# Patient Record
Sex: Male | Born: 1985 | Hispanic: No | Marital: Single | Smoking: Current some day smoker
Health system: Southern US, Community
[De-identification: ages and names within clinical notes are randomized; demographics above are authoritative.]

---
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
## 2013-05-19 ENCOUNTER — Emergency Department (HOSPITAL_COMMUNITY)
Admission: EM | Admit: 2013-05-19 | Discharge: 2013-05-20 | Disposition: A | Discharge status: Home / Self Care | Payer: 59 | Attending: Emergency Medicine | Admitting: Emergency Medicine

## 2013-05-19 ENCOUNTER — Emergency Department (HOSPITAL_COMMUNITY): Payer: 59

## 2013-05-19 DIAGNOSIS — F29 Unspecified psychosis not due to a substance or known physiological condition: Secondary | ICD-10-CM | POA: Insufficient documentation

## 2013-05-19 DIAGNOSIS — Y9389 Activity, other specified: Secondary | ICD-10-CM | POA: Insufficient documentation

## 2013-05-19 DIAGNOSIS — T401X4A Poisoning by heroin, undetermined, initial encounter: Secondary | ICD-10-CM | POA: Insufficient documentation

## 2013-05-19 DIAGNOSIS — IMO0002 Reserved for concepts with insufficient information to code with codable children: Secondary | ICD-10-CM | POA: Insufficient documentation

## 2013-05-19 DIAGNOSIS — Y929 Unspecified place or not applicable: Secondary | ICD-10-CM | POA: Insufficient documentation

## 2013-05-19 DIAGNOSIS — T401X1A Poisoning by heroin, accidental (unintentional), initial encounter: Secondary | ICD-10-CM | POA: Insufficient documentation

## 2013-05-19 LAB — COMPREHENSIVE METABOLIC PANEL
AST: 55 U/L — ABNORMAL HIGH (ref 0–37)
Albumin: 3.6 g/dL (ref 3.5–5.2)
Chloride: 101 mEq/L (ref 96–112)
Creatinine, Ser: 1.02 mg/dL (ref 0.50–1.35)
Total Bilirubin: 0.5 mg/dL (ref 0.3–1.2)
Total Protein: 6.9 g/dL (ref 6.0–8.3)

## 2013-05-19 LAB — CBC
MCHC: 33.7 g/dL (ref 30.0–36.0)
MCV: 81.9 fL (ref 78.0–100.0)
Platelets: 284 10*3/uL (ref 150–400)
RDW: 13.2 % (ref 11.5–15.5)
WBC: 26.4 10*3/uL — ABNORMAL HIGH (ref 4.0–10.5)

## 2013-05-19 LAB — SALICYLATE LEVEL: Salicylate Lvl: <2 mg/dL — ABNORMAL LOW (ref 2.8–20.0)

## 2013-05-19 LAB — ACETAMINOPHEN LEVEL: Acetaminophen (Tylenol), Serum: <15 ug/mL (ref 10–30)

## 2013-05-19 MED ORDER — ONDANSETRON HCL 4 MG/2ML IJ SOLN
4.0000 mg | Freq: Once | INTRAMUSCULAR | Status: AC
  Administered 2013-05-20: 4 mg via INTRAVENOUS
  Filled 2013-05-19: qty 2

## 2013-05-19 MED ORDER — SODIUM CHLORIDE 0.9 % IV BOLUS (SEPSIS)
1000.0000 mL | Freq: Once | INTRAVENOUS | Status: AC
  Administered 2013-05-20: 1000 mL via INTRAVENOUS
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
## 2013-05-19 NOTE — ED Notes (Signed)
Bed: RESA Expected date:  Expected time:  Means of arrival:  Comments: EMS Overdose 

## 2013-05-19 NOTE — ED Provider Notes (Signed)
CSN: [id number]     Arrival date & time 05/19/13  1955 History   First MD Initiated Contact with Patient 05/19/13 2041     Chief Complaint  Patient presents with  . Drug Overdose   (Consider location/radiation/quality/duration/timing/severity/associated sxs/prior Treatment) HPI Comments: Patient states he injected Heroin today remembers buying &20.00 worth. Last time remembered about 5PM./ Was found in parking lot unresponsive, EMS was called and patient was apneic- given Narcan IV and assisted respiration by bag.  Now awake-- states can not remember the incident   Patient is a 27 y.o. male presenting with Overdose. The history is provided by the patient and the EMS personnel.  Drug Overdose This is a new problem. The current episode started today. Pertinent negatives include no chest pain, fever or neck pain.    No past medical history on file. No past surgical history on file. No family history on file. History  Substance Use Topics  . Smoking status: Not on file  . Smokeless tobacco: Not on file  . Alcohol Use: Not on file    Review of Systems  Constitutional: Positive for activity change. Negative for fever.  HENT: Negative for neck pain and neck stiffness.   Eyes: Negative for photophobia and redness.  Respiratory: Negative for shortness of breath.   Cardiovascular: Negative for chest pain.  Skin: Positive for wound.  Psychiatric/Behavioral: Positive for confusion.    Allergies  Review of patient's allergies indicates no known allergies.  Home Medications  No current outpatient prescriptions on file. BP 129/79  Pulse 104  Temp(Src) 98.8 F (37.1 C) (Oral)  Resp 15  Ht 6\' 4"  (1.93 m)  Wt 180 lb (81.647 kg)  BMI 21.92 kg/m2  SpO2 92% Physical Exam  Nursing note and vitals reviewed. Constitutional: He appears well-developed and well-nourished.  HENT:  Head: Normocephalic. Head is with abrasion and with left periorbital erythema.    Right Ear: External ear  normal.  Left Ear: External ear normal.  Mouth/Throat: Oropharynx is clear and moist.  Eyes: Pupils are equal, round, and reactive to light.  Neck: Normal range of motion.  Pulmonary/Chest: Effort normal.  Abdominal: Soft. Bowel sounds are normal.  Musculoskeletal: Normal range of motion.       Right shoulder: Normal. He exhibits no bony tenderness.       Legs: 20 X30 oval red area inner L thigh Abrasion anterior R shoulder  Neurological: He is alert.  Skin: Skin is warm.    ED Course  Procedures (including critical care time) Labs Review Labs Reviewed  CBC - Abnormal; Notable for the following:    WBC 26.4 (*)    All other components within normal limits  COMPREHENSIVE METABOLIC PANEL - Abnormal; Notable for the following:    Glucose, Bld 202 (*)    AST 55 (*)    All other components within normal limits  SALICYLATE LEVEL - Abnormal; Notable for the following:    Salicylate Lvl <2.0 (*)    All other components within normal limits  URINE RAPID DRUG SCREEN (HOSP PERFORMED) - Abnormal; Notable for the following:    Opiates POSITIVE (*)    Tetrahydrocannabinol POSITIVE (*)    All other components within normal limits  ETHANOL  ACETAMINOPHEN LEVEL   Imaging Review Ct Head Wo Contrast  05/19/2013   *RADIOLOGY REPORT*  Clinical Data: Trauma.  CT HEAD WITHOUT CONTRAST  Technique:  Contiguous axial images were obtained from the base of the skull through the vertex without contrast.  Comparison: 02/06/2005  Findings:  Skull:No acute osseous abnormality. No lytic or blastic lesion.  Orbits: No acute abnormality.  Brain: No evidence of acute abnormality, such as acute infarction, hemorrhage, hydrocephalus, or mass lesion/mass effect.Incidental dilated perivascular space in the inferior left cerebrum.  IMPRESSION:  No evidence of acute intracranial injury.   Original Report Authenticated By: Tiburcio Pea    MDM   1. Heroin overdose, initial encounter     Heroin overdose will  continue monitoring   Paitent feeling better Offered detox counseling-- not interested  PAtient has maintianed his respiratory status safe to DC home   Arman Filter, NP 05/20/13 [phone]

## 2013-05-20 LAB — RAPID URINE DRUG SCREEN, HOSP PERFORMED
Benzodiazepines: NOT DETECTED
Cocaine: NOT DETECTED
Opiates: POSITIVE — AB

## 2013-05-20 NOTE — Discharge Instructions (Signed)
RESOURCE GUIDE ° °Chronic Pain Problems: °Contact [facility]  [phone] °Patients need to be referred by their primary care doctor. ° °Insufficient Money for Medicine: °Contact United Way:  call [phone]) 892-1162 ° °No Primary Care Doctor: °- Call [facility]  [phone] - can help you locate a primary care doctor that  accepts your insurance, provides certain services, etc. °- Physician Referral Service- 1-800-533-3463 ° °Agencies that provide inexpensive medical care: °- [facility]  [phone] °- [facility]  [phone] °- [facility]  [phone] °- [facility]  832-4777 °- [facility]  [phone] °- [facility]  [phone] ° °Medicaid-accepting [place] Providers: °- [facility]- [address] ° [phone], Mon-Fri 9am-7pm, Sat 9am-1pm °- [facility]- [address], [address] ° [phone] °- [facility]- [address] ° [phone] °- [facility]- [address] [place] [address] ° [phone] °- [facility]- [address], [phone] ° Only accepts [place] Access Medicaid patients after they have their name  applied to their card ° °Self Pay (no insurance) in [place]: °- Sickle Cell Patients - [facility] ° [address], [phone] °- [facility]- [address] ° [phone] °      -     [facility]- [address] °      -     [facility]- see information above (Speak to Pam H if you do not have insurance) °      -  [facility] [place]- [address],  [phone] °      -  [facility]- [address] [place] [address], [phone] °      -  Dr Osei-Bonsu-  [address], [place], [phone] °      -  [facility] - [address], [phone] °      -  [facility]- [address] [place] [address], [phone], also  [address], [phone] °      -     [facility]- [address], [phone], 1st & 3rd Saturday °        every month, 10am-1pm ° -     [facility] °  [address], [place]. °  Phone:  [phone], Fax:  [phone]. Hours of Operation:  9 am - 6 pm, M-F. ° -     [facility] °  [address], [place] °  Phone: [phone], Fax: [phone]. Hours of Operation:  8:30 am - 5:30 pm, M-F. ° °[facility] °[address] °, [place] [address] °(336) [phone] ° °[facility] °[address] °[place], [place] [address] °(336) [phone] ° °1) Find a Doctor and Pay Out of Pocket °Although you won't have to find out who is covered by your insurance plan, it is a good idea to ask around and get recommendations. You will then need to call the office and see if the doctor you have chosen will accept you as a new patient and what types of options they offer for patients who are self-pay. Some doctors offer discounts or will set up payment plans for their patients who do not have insurance, but   you will need to ask so you aren't surprised when you get to your appointment. ° °2) Contact Your Local Health Department °Not all health departments have doctors that can see patients for sick visits, but many do, so it is worth a call to see if yours does. If you don't know where your local health department is, you can check in your phone book. The CDC also has a tool to help you locate your state's health department, and many state websites also have listings of all of their local health departments. ° °3) Find a Walk-in Clinic °If your illness is not likely to be very severe or complicated, you may want to try a walk in clinic. These are popping up all over the country in pharmacies, drugstores, and shopping centers. They're usually staffed by nurse practitioners or physician assistants that have been trained  to treat common illnesses and complaints. They're usually fairly quick and inexpensive. However, if you have serious medical issues or chronic medical problems, these are probably not your best option ° °STD Testing °- [place] [facility] [place], [facility], [address], [place], phone [phone] or 1-877-539-9860.  Monday - Friday, call for an appointment. °- [place] [facility] [place], [facility], [address], [place], phone [phone] or 1-877-539-9860.  Monday - Friday, call for an appointment. ° °Abuse/Neglect: °- [place] Child Abuse Hotline (336) 641-3795 °- [place] Child Abuse Hotline 800-378-5315 (After Hours) ° °Emergency Shelter:  [facility] (336) 271-5985 ° °Maternity Homes: °- [facility] (336) 275-9566 °- [facility] (704) 372-4663 ° °MRSA Hotline #:   832-7006 ° °Dental Assistance °If unable to pay or uninsured, contact:  [place] [facility] to become qualified for the adult dental clinic. ° °Patients with Medicaid: [facility] °[address], [phone] °[address], [phone] ° °If unable to pay, or uninsured, contact [place] [facility] ([phone] in [place], [phone] in [place]) to become qualified for the adult dental clinic ° °[facility] °[address] °[place], [place] [address] °(336) 272-4177 °www.[facility].com ° °Other Low-Cost Community Dental Services: °- [facility]- [address], [place], [place], [address], [phone], Ext. 123, 2nd and 4th Thursday of the month at 6:30am.  10 clients each day by appointment, can sometimes see walk-in patients if someone does not show for an appointment. °- [facility]- [address], [place], [place], [address], [phone] °- [facility]- [address], [place], [place], [address], [phone] °- [place]  [facility]- [phone] °- [facility]- 703-3100 °-  [facility]- 570-6415 ° °     Behavioral Health Resources in the Community ° °Intensive Outpatient Programs: °[facility]      °[address] °[place], [place] °336-878-6098 °Both a day and evening program °      °[facility]     °[address]        °[place], [place] [address] °336-832-9800        ° °[facility] °[address] °[place] °336-882-2125 ° °[place] [place] [facility] °ACCESS LINE: 1-800-853-5163 or 336-641-4981 °[address] °[place],  [address] °Http://www.[email or web address].com/services/adult.htm ° ° °Substance Abuse Resources: °- [facility]  336-882-2125 °- [facility] 336-784-9470 °- [facility] 336-285-9073 °- [facility] 336-845-3988 °-   Residential & Outpatient Substance Abuse Program  800-659-3381 ° °Psychological Services: °- [facility]  832-9600 °- [facility]  378-7881 °- [facility], [address], [place], ACCESS LINE: 1-800-853-5163 or 336-641-4981, Http://www.[facility].com/services/adult.htm ° °Mobile Crisis Teams:         °                               °[facility]         °Mobile Crisis Care Unit °1-877-626-1772       °      °Assertive °[facility] °[address] [place] °336-834-9664 °                                        °Interventionist °Sharon DeEsch °[address] °[place] °336-554-5454 ° °Self-Help/Support Groups: °[facility] [place] Variety of support groups °373-1402 (call for more info) ° °Narcotics Anonymous (NA) °[facility] °[address] °[place] - 2 meetings at this location ° °Residential Treatment Programs:  °[facility]      °[address]        °[place]       °866-801-8205        ° °[facility] °[address] °[place], [place]  [address] °704-293-8524 ° °[facility]  °[address] °[place], [place] [address] °336-845-3988 °Admissions: 8am-3pm M-F ° °[facility]     °[address]        °[place], [place] [address]       °336-841-1104        ° °[facility] °[address] °[place], [place] °336-379-7146 ° °[facility] °[address] °[place], [place] °336-285-9073 ° °[facility] - Intensive Outpatient      °[address]     °[place], [place]       °852-3033        ° °[facility]     °[address] °[place], [place] °877-615-2722 or 336-784-9470 ° °[facility] ([facility]), Medicaid °[address] °[place], [place] °336-227-7417 ° °[facility]                                               °[address] °[place] °800-659-3381 ° °[place] [facility] Resources: °[facility]- 1-888-581-9988              ° °General Therapy                                                °Julie Brannon, PhD        °[address]                                       °[place], [place] [address]         °336-349-5553   °Insurance ° °[facility]   [address] [place], [place] [address] [phone]  [facility] [address] [place], [place] [address] [phone] Insurance/Medicaid/sponsorship through [facility]                                              [address]                                        [place], [place] [address]    Therapy/tele-psych/case         [phone]          [facility] [address], [place]  [address]  Adolescent/group home/case management [phone]                                           Creola Corn PhD       General therapy       Insurance   [phone]         Dr. Lolly Mustache, [place], M-F [phone]  [facility]  [employer] [facility] Dept. [address]                 [address]         [address] [place] [address]  [place] Reveal Phone:  [phone]                                  Phone:  [phone]                   Phone:  [phone]  [facility], [phone] - [facility] - [facility]- [phone]       -     [facility] in [place], [address]             [phone], Insurance  [place] Child Abuse Hotline 564-157-2107 or 912-279-0826 (After Hours) If you cahnge your mind about Detox or drug rehab you have been given outpatient resources or return for further assessment

## 2013-05-20 NOTE — ED Notes (Signed)
Patient is alert and oriented x3.  He was given DC instructions and follow up visit instructions.  Patient gave verbal understanding.  He was DC ambulatory under his own power to home.  V/S stable.  He was not showing any signs of distress on DC 

## 2013-05-21 NOTE — ED Provider Notes (Signed)
Medical screening examination/treatment/procedure(s) were performed by non-physician practitioner and as supervising physician I was immediately available for consultation/collaboration.    Jalecia Leon L Tashunda Vandezande, MD 05/21/13 0727 

## 2013-05-25 ENCOUNTER — Encounter (HOSPITAL_COMMUNITY): Payer: Self-pay | Admitting: Emergency Medicine

## 2013-05-25 ENCOUNTER — Emergency Department (HOSPITAL_COMMUNITY)
Admission: EM | Admit: 2013-05-25 | Discharge: 2013-05-25 | Disposition: A | Discharge status: Home / Self Care | Payer: 59 | Attending: Emergency Medicine | Admitting: Emergency Medicine

## 2013-05-25 DIAGNOSIS — F17200 Nicotine dependence, unspecified, uncomplicated: Secondary | ICD-10-CM | POA: Insufficient documentation

## 2013-05-25 DIAGNOSIS — T401X4A Poisoning by heroin, undetermined, initial encounter: Secondary | ICD-10-CM | POA: Insufficient documentation

## 2013-05-25 DIAGNOSIS — IMO0002 Reserved for concepts with insufficient information to code with codable children: Secondary | ICD-10-CM | POA: Insufficient documentation

## 2013-05-25 DIAGNOSIS — L02414 Cutaneous abscess of left upper limb: Secondary | ICD-10-CM

## 2013-05-25 DIAGNOSIS — Y929 Unspecified place or not applicable: Secondary | ICD-10-CM | POA: Insufficient documentation

## 2013-05-25 DIAGNOSIS — Y939 Activity, unspecified: Secondary | ICD-10-CM | POA: Insufficient documentation

## 2013-05-25 DIAGNOSIS — T401X1A Poisoning by heroin, accidental (unintentional), initial encounter: Secondary | ICD-10-CM | POA: Insufficient documentation

## 2013-05-25 MED ORDER — SULFAMETHOXAZOLE-TRIMETHOPRIM 800-160 MG PO TABS: 1.0000 | ORAL_TABLET | Freq: Two times a day (BID) | ORAL | Status: AC

## 2013-05-25 MED ORDER — SULFAMETHOXAZOLE-TMP DS 800-160 MG PO TABS
1.0000 | ORAL_TABLET | Freq: Once | ORAL | Status: AC
  Administered 2013-05-25: 1 via ORAL
  Filled 2013-05-25: qty 1

## 2013-05-25 NOTE — ED Notes (Signed)
Bed: RESA Expected date: 05/25/13 Expected time: 4:35 PM Means of arrival: Ambulance Comments: Heroin OD
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
## 2017-09-26 ENCOUNTER — Emergency Department (HOSPITAL_COMMUNITY): Admission: EM | Admit: 2017-09-26 | Discharge: 2017-09-26 | Discharge status: Against Medical Advice | Payer: Self-pay

## 2020-10-10 ENCOUNTER — Emergency Department (HOSPITAL_BASED_OUTPATIENT_CLINIC_OR_DEPARTMENT_OTHER): Payer: Self-pay

## 2020-10-10 ENCOUNTER — Other Ambulatory Visit: Payer: Self-pay

## 2020-10-10 ENCOUNTER — Emergency Department (HOSPITAL_BASED_OUTPATIENT_CLINIC_OR_DEPARTMENT_OTHER)
Admission: EM | Admit: 2020-10-10 | Discharge: 2020-10-10 | Disposition: A | Discharge status: Home / Self Care | Payer: Self-pay | Attending: Emergency Medicine | Admitting: Emergency Medicine

## 2020-10-10 ENCOUNTER — Encounter (HOSPITAL_BASED_OUTPATIENT_CLINIC_OR_DEPARTMENT_OTHER): Payer: Self-pay | Admitting: Emergency Medicine

## 2020-10-10 DIAGNOSIS — R000 Tachycardia, unspecified: Secondary | ICD-10-CM | POA: Insufficient documentation

## 2020-10-10 DIAGNOSIS — L0201 Cutaneous abscess of face: Secondary | ICD-10-CM | POA: Insufficient documentation

## 2020-10-10 DIAGNOSIS — F17210 Nicotine dependence, cigarettes, uncomplicated: Secondary | ICD-10-CM | POA: Insufficient documentation

## 2020-10-10 DIAGNOSIS — L0291 Cutaneous abscess, unspecified: Secondary | ICD-10-CM

## 2020-10-10 LAB — APTT: aPTT: 30 seconds (ref 24–36)

## 2020-10-10 LAB — COMPREHENSIVE METABOLIC PANEL
ALT: 372 U/L — ABNORMAL HIGH (ref 0–44)
AST: 254 U/L — ABNORMAL HIGH (ref 15–41)
Albumin: 4.1 g/dL (ref 3.5–5.0)
Alkaline Phosphatase: 86 U/L (ref 38–126)
Anion gap: 16 — ABNORMAL HIGH (ref 5–15)
BUN: 18 mg/dL (ref 6–20)
CO2: 24 mmol/L (ref 22–32)
Calcium: 9.2 mg/dL (ref 8.9–10.3)
Chloride: 94 mmol/L — ABNORMAL LOW (ref 98–111)
Creatinine, Ser: 0.87 mg/dL (ref 0.61–1.24)
GFR, Estimated: >60 mL/min (ref >60)
Glucose, Bld: 82 mg/dL (ref 70–99)
Potassium: 3.4 mmol/L — ABNORMAL LOW (ref 3.5–5.1)
Sodium: 134 mmol/L — ABNORMAL LOW (ref 135–145)
Total Bilirubin: 1.7 mg/dL — ABNORMAL HIGH (ref 0.3–1.2)
Total Protein: 8.2 g/dL — ABNORMAL HIGH (ref 6.5–8.1)

## 2020-10-10 LAB — CBC WITH DIFFERENTIAL/PLATELET
Abs Immature Granulocytes: 0.03 10*3/uL (ref 0.00–0.07)
Basophils Absolute: 0 10*3/uL (ref 0.0–0.1)
Basophils Relative: 0 %
Eosinophils Absolute: 0.1 10*3/uL (ref 0.0–0.5)
Eosinophils Relative: 1 %
HCT: 47 % (ref 39.0–52.0)
Hemoglobin: 16.1 g/dL (ref 13.0–17.0)
Immature Granulocytes: 0 %
Lymphocytes Relative: 7 %
Lymphs Abs: 0.8 10*3/uL (ref 0.7–4.0)
MCH: 29.5 pg (ref 26.0–34.0)
MCHC: 34.3 g/dL (ref 30.0–36.0)
MCV: 86.1 fL (ref 80.0–100.0)
Monocytes Absolute: 1 10*3/uL (ref 0.1–1.0)
Monocytes Relative: 8 %
Neutro Abs: 9.9 10*3/uL — ABNORMAL HIGH (ref 1.7–7.7)
Neutrophils Relative %: 84 %
Platelets: 306 10*3/uL (ref 150–400)
RBC: 5.46 MIL/uL (ref 4.22–5.81)
RDW: 12.7 % (ref 11.5–15.5)
WBC: 11.7 10*3/uL — ABNORMAL HIGH (ref 4.0–10.5)
nRBC: 0 % (ref 0.0–0.2)

## 2020-10-10 LAB — ETHANOL: Alcohol, Ethyl (B): 16 mg/dL — ABNORMAL HIGH (ref <10)

## 2020-10-10 LAB — PROTIME-INR
INR: 1 (ref 0.8–1.2)
Prothrombin Time: 12.9 seconds (ref 11.4–15.2)

## 2020-10-10 LAB — LACTIC ACID, PLASMA: Lactic Acid, Venous: 1.7 mmol/L (ref 0.5–1.9)

## 2020-10-10 MED ORDER — LORAZEPAM 2 MG/ML IJ SOLN: 0.0000 mg | Freq: Two times a day (BID) | INTRAMUSCULAR | Status: DC

## 2020-10-10 MED ORDER — LORAZEPAM 1 MG PO TABS: 0.0000 mg | ORAL_TABLET | Freq: Four times a day (QID) | ORAL | Status: DC

## 2020-10-10 MED ORDER — FENTANYL CITRATE (PF) 100 MCG/2ML IJ SOLN
50.0000 ug | INTRAMUSCULAR | Status: DC
  Filled 2020-10-10: qty 2

## 2020-10-10 MED ORDER — THIAMINE HCL 100 MG PO TABS
100.0000 mg | ORAL_TABLET | Freq: Every day | ORAL | Status: DC
  Administered 2020-10-10: 100 mg via ORAL
  Filled 2020-10-10: qty 1

## 2020-10-10 MED ORDER — BUPIVACAINE HCL (PF) 0.5 % IJ SOLN
20.0000 mL | Freq: Once | INTRAMUSCULAR | Status: DC
  Filled 2020-10-10: qty 20

## 2020-10-10 MED ORDER — IOHEXOL 300 MG/ML  SOLN
100.0000 mL | Freq: Once | INTRAMUSCULAR | Status: AC | PRN
  Administered 2020-10-10: 80 mL via INTRAVENOUS

## 2020-10-10 MED ORDER — LORAZEPAM 2 MG/ML IJ SOLN
1.0000 mg | Freq: Once | INTRAMUSCULAR | Status: DC
  Administered 2020-10-10: 1 mg via INTRAVENOUS
  Filled 2020-10-10: qty 1

## 2020-10-10 MED ORDER — CLINDAMYCIN HCL 150 MG PO CAPS: 450.0000 mg | ORAL_CAPSULE | Freq: Three times a day (TID) | ORAL | 0 refills | Status: AC

## 2020-10-10 MED ORDER — SODIUM CHLORIDE 0.9 % IV BOLUS
1000.0000 mL | Freq: Once | INTRAVENOUS | Status: AC
  Administered 2020-10-10: 1000 mL via INTRAVENOUS

## 2020-10-10 MED ORDER — THIAMINE HCL 100 MG/ML IJ SOLN: 100.0000 mg | Freq: Every day | INTRAMUSCULAR | Status: DC

## 2020-10-10 MED ORDER — CLINDAMYCIN PHOSPHATE 900 MG/50ML IV SOLN
900.0000 mg | Freq: Once | INTRAVENOUS | Status: AC
  Administered 2020-10-10: 900 mg via INTRAVENOUS
  Filled 2020-10-10: qty 50

## 2020-10-10 MED ORDER — ACETAMINOPHEN 500 MG PO TABS
1000.0000 mg | ORAL_TABLET | Freq: Once | ORAL | Status: AC
  Administered 2020-10-10: 1000 mg via ORAL
  Filled 2020-10-10: qty 2

## 2020-10-10 MED ORDER — LORAZEPAM 1 MG PO TABS: 0.0000 mg | ORAL_TABLET | Freq: Two times a day (BID) | ORAL | Status: DC

## 2020-10-10 MED ORDER — LORAZEPAM 2 MG/ML IJ SOLN
2.0000 mg | Freq: Once | INTRAMUSCULAR | Status: AC
  Administered 2020-10-10: 1 mg via INTRAVENOUS

## 2020-10-10 MED ORDER — LORAZEPAM 2 MG/ML IJ SOLN
0.0000 mg | Freq: Four times a day (QID) | INTRAMUSCULAR | Status: DC
  Administered 2020-10-10: 1 mg via INTRAVENOUS
  Filled 2020-10-10 (×2): qty 1

## 2020-10-10 MED ORDER — LACTATED RINGERS IV BOLUS
1500.0000 mL | Freq: Once | INTRAVENOUS | Status: AC
  Administered 2020-10-10: 1500 mL via INTRAVENOUS

## 2020-10-10 MED ORDER — KETOROLAC TROMETHAMINE 30 MG/ML IJ SOLN
30.0000 mg | Freq: Once | INTRAMUSCULAR | Status: AC
  Administered 2020-10-10: 30 mg via INTRAVENOUS
  Filled 2020-10-10: qty 1

## 2020-10-10 NOTE — Discharge Instructions (Signed)
It is vitally important for you to follow-up with ear nose and throat.  I given you the information. Please take clindamycin for the next 10 days as prescribed.  Please take every dose until completion.  Please use warm compresses on your face 3-4 times per day.

## 2020-10-10 NOTE — ED Triage Notes (Signed)
Pt arrives [facility] with c/o abscess on right side of face. Swelling and redness noted. Per EMS, pt hypertensive and reports no food or drink po for 2 days, drank 1 pint ETOH 1 hr pta. Used meth 3 days pta.

## 2020-10-10 NOTE — ED Provider Notes (Signed)
[facility] EMERGENCY DEPARTMENT Provider Note   CSN: [id number] Arrival date & time: 10/10/20  1616     History Chief Complaint  Patient presents with  . Abscess    Richard Cameron is a 35 y.o. male.  HPI Patient is 35 year old male with past medical history significant for severe alcohol use disorder no history of DT, withdrawal seizure or known cirrhosis.  Approximately 1/5 of liquor per day.  States he has been methamphetamines for several months.   Relapsed today.  States that he has had swelling and pain to the skin for he states that he developed pain over the past few days.  He has had abscesses in the past that have been drained in the hospital.  He reports that he has been having difficulty opening closing his mouth as result of the pain.  He states he is achy constant severe worse with chewing.  He denies any dental pain.  He states that he is aware that he has some liver issues as result of his severe drinking.  He denies any fevers or chills although he was noted to have an objective fever of 100.3 in triage.  No lightheadedness or dizziness.  He states he feels somewhat anxious and jittery he denies any history of withdrawal seizures.  No other associate symptoms.  No aggravating mitigating factors.  He has tried no medications prior to arrival in ER.     History reviewed. No pertinent past medical history.  There are no problems to display for this patient.   History reviewed. No pertinent surgical history.     History reviewed. No pertinent family history.  Social History   Tobacco Use  . Smoking status: Current Some Day Smoker    Packs/day: 0.50    Types: Cigarettes  Substance Use Topics  . Alcohol use: Yes    Comment: socially  . Drug use: Yes    Types: IV, Marijuana, Methamphetamines    Home Medications Prior to Admission medications   Medication Sig Start Date End Date Taking? Authorizing Provider  clindamycin (CLEOCIN) 150 MG  capsule Take 3 capsules (450 mg total) by mouth 3 (three) times daily for 10 days. 10/10/20 10/20/20 Yes Cadey Bazile S, PA  sulfamethoxazole-trimethoprim (SEPTRA DS) 800-160 MG per tablet Take 1 tablet by mouth 2 (two) times daily. 05/25/13   Azalia Bilis, MD    Allergies    Patient has no known allergies.  Review of Systems   Review of Systems  Constitutional: Positive for fatigue and fever. Negative for chills.  HENT: Negative for congestion.   Eyes: Negative for pain.  Respiratory: Negative for cough and shortness of breath.   Cardiovascular: Negative for chest pain and leg swelling.  Gastrointestinal: Negative for abdominal pain, diarrhea, nausea and vomiting.  Genitourinary: Negative for dysuria.  Musculoskeletal: Negative for myalgias.  Skin: Positive for wound. Negative for rash.       Abscess to face  Neurological: Negative for dizziness and headaches.    Physical Exam Updated Vital Signs BP (!) 139/91 (BP Location: Right Arm)   Pulse 96   Temp 98.9 F (37.2 C) (Oral)   Resp 15   Ht 6\' 4"  (1.93 m)   Wt 102.1 kg   SpO2 98%   BMI 27.39 kg/m   Physical Exam Vitals and nursing note reviewed.  Constitutional:      General: He is not in acute distress.    Comments: 35 year old male appears older than stated age  HENT:  Head: Normocephalic and atraumatic.     Nose: Nose normal.     Mouth/Throat:     Mouth: Mucous membranes are dry.  Eyes:     General: No scleral icterus. Cardiovascular:     Rate and Rhythm: Regular rhythm. Tachycardia present.     Pulses: Normal pulses.     Heart sounds: Normal heart sounds.  Pulmonary:     Effort: Pulmonary effort is normal. No respiratory distress.     Breath sounds: Normal breath sounds. No wheezing.  Abdominal:     Palpations: Abdomen is soft.     Tenderness: There is no abdominal tenderness. There is no guarding or rebound.  Musculoskeletal:     Cervical back: Normal range of motion.     Right lower leg: No edema.      Left lower leg: No edema.  Skin:    General: Skin is warm and dry.     Capillary Refill: Capillary refill takes less than 2 seconds.     Comments: 1.5 cm opening to a approximately 4 cm in diameter abscess located over the right zygomatic process.  There is fluctuance and there is purulent drainage present. Indurated tissue surrounding this.  Neurological:     Mental Status: He is alert. Mental status is at baseline.  Psychiatric:        Mood and Affect: Mood normal.        Behavior: Behavior normal.     ED Results / Procedures / Treatments   Labs (all labs ordered are listed, but only abnormal results are displayed) Labs Reviewed  COMPREHENSIVE METABOLIC PANEL - Abnormal; Notable for the following components:      Result Value   Sodium 134 (*)    Potassium 3.4 (*)    Chloride 94 (*)    Total Protein 8.2 (*)    AST 254 (*)    ALT 372 (*)    Total Bilirubin 1.7 (*)    Anion gap 16 (*)    All other components within normal limits  CBC WITH DIFFERENTIAL/PLATELET - Abnormal; Notable for the following components:   WBC 11.7 (*)    Neutro Abs 9.9 (*)    All other components within normal limits  ETHANOL - Abnormal; Notable for the following components:   Alcohol, Ethyl (B) 16 (*)    All other components within normal limits  CULTURE, BLOOD (SINGLE)  LACTIC ACID, PLASMA  PROTIME-INR  APTT    EKG EKG Interpretation  Date/Time:  Saturday October 10 2020 16:38:45 EST Ventricular Rate:  126 PR Interval:    QRS Duration: 72 QT Interval:  309 QTC Calculation: 448 R Axis:   17 Text Interpretation: Sinus tachycardia Borderline T abnormalities, diffuse leads Baseline wander in lead(s) V2 No STEMI Confirmed by Alvester Chou [phone]) on 10/10/2020 4:41:20 PM   Radiology CT Maxillofacial W Contrast  Result Date: 10/10/2020 CLINICAL DATA:  Maxillary/facial abscess concern for tracking into face/mouth as there is trismus EXAM: CT MAXILLOFACIAL WITH CONTRAST TECHNIQUE:  Multidetector CT imaging of the maxillofacial structures was performed with intravenous contrast. Multiplanar CT image reconstructions were also generated. CONTRAST:  46mL OMNIPAQUE IOHEXOL 300 MG/ML  SOLN COMPARISON:  11/26/2017 and prior. FINDINGS: Osseous: No fracture or mandibular dislocation. No destructive process. 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 Orbits: Negative. No traumatic or inflammatory finding. Sinuses: Mild sinus mucosal thickening and motor secretions. Clear mastoid air cells. Soft tissues: Right cheek soft tissue swelling and dermal thickening. There is a focal cutaneous defect at the  level of the zygoma (2: 58) within underlying ill-defined pocket of free fluid measuring 1.4 x 1.9 cm. Reactive inflammatory changes are seen within the right masticator and platysma. Mild reactive inflammation involving the right parotid. Dental amalgam beam hardening artifact limits evaluation. Prominent right cervical nodes are reactive. Limited intracranial: No significant or unexpected finding. IMPRESSION: Right facial cellulitis with focal cutaneous defect at the level of the zygoma. Underlying ill-defined fluid collection measuring 1.9 cm may reflect early abscess formation. Reactive inflammatory changes involving the right parotid, masseter muscle and platysma. Bilateral upper molar carious disease. Electronically Signed   By: Stana Bunting M.D.   On: 10/10/2020 18:41   DG Chest Port 1 View  Result Date: 10/10/2020 CLINICAL DATA:  35 year old male with concern for sepsis. EXAM: PORTABLE CHEST 1 VIEW COMPARISON:  None. FINDINGS: The lungs are clear. There is no pleural effusion pneumothorax. The cardiac silhouette is within limits. No acute osseous pathology. IMPRESSION: No active disease. Electronically Signed   By: Elgie Collard M.D.   On: 10/10/2020 17:20    Procedures Ultrasound ED Peripheral IV (Provider)  Date/Time: 10/10/2020 4:53 PM Performed by: Gailen Shelter,  PA Authorized by: Gailen Shelter, PA   Procedure details:    Indications: hydration, multiple failed IV attempts and poor IV access     Skin Prep: chlorhexidine gluconate     Location:  Right AC   Angiocath:  20 G   Bedside Ultrasound Guided: Yes     Images: not archived     Patient tolerated procedure without complications: Yes     Dressing applied: Yes   Comments:     Patient tolerated bedside ultrasound guided IV without difficulty.  Marland Kitchen.Incision and Drainage  Date/Time: 10/11/2020 12:05 AM Performed by: Gailen Shelter, PA Authorized by: Gailen Shelter, PA   Consent:    Consent obtained:  Verbal   Consent given by:  Patient   Risks discussed:  Bleeding, incomplete drainage, pain and damage to other organs   Alternatives discussed:  No treatment Universal protocol:    Procedure explained and questions answered to patient or proxy's satisfaction: yes     Relevant documents present and verified: yes     Test results available : yes     Imaging studies available: yes     Required blood products, implants, devices, and special equipment available: yes     Site/side marked: yes     Immediately prior to procedure, a time out was called: yes     Patient identity confirmed:  Verbally with patient Location:    Type:  Abscess   Size:  3 cm   Location:  Head (face)   Head location:  Face Pre-procedure details:    Skin preparation:  Chlorhexidine Anesthesia:    Anesthesia method:  Local infiltration   Local anesthetic:  Bupivacaine 0.5% WITH epi Procedure type:    Complexity:  Complex Procedure details:    Incision types:  Single straight   Incision depth:  Subcutaneous   Scalpel blade:  11   Wound management:  Probed and deloculated, irrigated with saline and extensive cleaning   Drainage:  Purulent   Drainage amount:  Moderate   Wound treatment:  Wound left open Post-procedure details:    Procedure completion:  Tolerated well, no immediate complications    (including critical care time)  Medications Ordered in ED Medications  LORazepam (ATIVAN) injection 0-4 mg (1 mg Intravenous Given 10/10/20 1720)    Or  LORazepam (ATIVAN) tablet 0-4  mg ( Oral See Alternative 10/10/20 1720)  LORazepam (ATIVAN) injection 0-4 mg (has no administration in time range)    Or  LORazepam (ATIVAN) tablet 0-4 mg (has no administration in time range)  thiamine tablet 100 mg (100 mg Oral Given 10/10/20 1827)    Or  thiamine (B-1) injection 100 mg ( Intravenous See Alternative 10/10/20 1827)  bupivacaine (MARCAINE) 0.5 % injection 20 mL (has no administration in time range)  acetaminophen (TYLENOL) tablet 1,000 mg (1,000 mg Oral Given 10/10/20 1655)  clindamycin (CLEOCIN) IVPB 900 mg ( Intravenous Stopped 10/10/20 1826)  ketorolac (TORADOL) 30 MG/ML injection 30 mg (30 mg Intravenous Given 10/10/20 1737)  sodium chloride 0.9 % bolus 1,000 mL ( Intravenous Stopped 10/10/20 1938)  iohexol (OMNIPAQUE) 300 MG/ML solution 100 mL (80 mLs Intravenous Contrast Given 10/10/20 1746)  lactated ringers bolus 1,500 mL ( Intravenous Stopped 10/10/20 2126)  LORazepam (ATIVAN) injection 2 mg (1 mg Intravenous Given 10/10/20 1935)    ED Course  I have reviewed the triage vital signs and the nursing notes.  Pertinent labs & imaging results that were available during my care of the patient were reviewed by me and considered in my medical decision making (see chart for details).  Patient is 35 year old male presented to ED with abscess of the right face.  This is from picking at his skin as a result of his amphetamine use.  He is a heavy drinker he is tachycardic and very dehydrated appearing.  Patient did receive sepsis work-up received fluid resuscitation small dose of Ativan and CT imaging of face obtained.  Physical exam notable for abscess of face.  Clinical Course as of 10/11/20 0004  Sat Oct 10, 2020  1642 35 yo male presenting to ED with abscess in right face.  Reports he was  picking at his skin on the cheek and developed swelling and pain a few days ago.  He's had abscesses in the past that have been drained in the hospital.  Reports difficulty eating and opening mouth due to pain.  Also drinking alcohol heavily and using meth most recently 3 days ago.  NKDA.  ON exam tachycardic, febrile, has swelling and pain of the face, tense abscess involving right maxilla, trismus due to pain, and poor dentitia.  Plan for sepsis evaluation, CT face with IV contrast, IV clindamycin, and we'll reassess for bedside I&D vs hospitalizaiton [MT]  1734 Chest x-ray-agree of radiology read no infiltrate or evidence of infection.  EKG is sinus tachycardia.  No acute abnormalities. [WF]  1734 CBC with mild leukocytosis RBC count/hemoglobin at the high end of normal.  Very well could be secondary to dehydration however also could be secondary to abscess of face.  CMP with electrolyte abnormalities that are mild and likely related to poor intake.  AST and ALT elevated likely secondary to significant alcohol intake. [WF]  1846 CT max face shows early abscess formation. Plan is to incise and drain. [WF]  1908 CMP with significant transaminitis.  Patient made aware of this.  Lengthy education conversation had with patient he is aware of possibility of impending liver failure and the complications of this. [WF]  1930 Coags within normal months.  Lactic within normal limits.  Blood cultures pending. [WF]  1930 Plan is to discharge with 10 days of clindamycin and ENT follow-up [WF]    Clinical Course User Index [MT] Trifan, Kermit BaloMatthew J, MD [WF] Gailen ShelterFondaw, Trinnity Breunig S, [place]    MDM Rules/Calculators/A&P  Discharged with Clindamycin and CLOSE follow up with ENT.  Patient's tachycardia has resolved here in the ER with fluids.  Suspect severe dehydration as a cause of his tachycardia.  Questionable whether he may have had some early withdrawal.  He does seem significantly improved now.   Is ambulatory.  Discussed Librium taper with patient and he will follow closely with PCP instead.  Given return cautions.  Ambulates without difficulty before discharge.  Abscess was successfully drained.  He is understanding of need for warm compresses and completion of antibiotic regimen.  Final Clinical Impression(s) / ED Diagnoses Final diagnoses:  Abscess    Rx / DC Orders ED Discharge Orders         Ordered    clindamycin (CLEOCIN) 150 MG capsule  3 times daily        10/10/20 2137           Solon AugustaFondaw, Adasha Boehme GibbonS, [place] 10/11/20 0101    Terald Sleeperrifan, Matthew J, MD 10/11/20 [phone]

## 2020-10-10 NOTE — ED Notes (Signed)
In to round on client, removed his own IV, stated he had a family emergency and had to leave, spoke with and informed PA, Rx provided along with AVS, noted pt to be very anxious, noted hands to be shaking as well, offered to assist pt with obtaining a ride, stated he would utilize [employer] [place] with pt currently assisting pt using phone. Copy of AVS provided

## 2020-10-10 NOTE — ED Notes (Signed)
This RN walked into room due to patient yelling "Someone come in here!!" Pt had pulled out his IV line, and was attempting to remove all equipment stating "I have to go, something is wrong with one of my kids". RN asked the pt to be patient while the provider is located to ensure patient has everything he needs before leaving.

## 2020-10-10 NOTE — ED Notes (Signed)
BC X 2 OBTAINED PRIOR TO ABX THERAPY °

## 2020-10-10 NOTE — ED Notes (Signed)
ED Provider at bedside. Stevphen Meuse, PA

## 2020-10-10 NOTE — ED Notes (Signed)
ABX, IVF bolus on Pause, pt to CT scan per EDP orders

## 2020-10-11 NOTE — ED Provider Notes (Incomplete)
[facility] EMERGENCY DEPARTMENT Provider Note   CSN: [id number] Arrival date & time: 10/10/20  1616     History Chief Complaint  Patient presents with  . Abscess    Richard Cameron is a 35 y.o. male.  HPI        History reviewed. No pertinent past medical history.  There are no problems to display for this patient.   History reviewed. No pertinent surgical history.     History reviewed. No pertinent family history.  Social History   Tobacco Use  . Smoking status: Current Some Day Smoker    Packs/day: 0.50    Types: Cigarettes  Substance Use Topics  . Alcohol use: Yes    Comment: socially  . Drug use: Yes    Types: IV, Marijuana    Home Medications Prior to Admission medications   Medication Sig Start Date End Date Taking? Authorizing Provider  sulfamethoxazole-trimethoprim (SEPTRA DS) 800-160 MG per tablet Take 1 tablet by mouth 2 (two) times daily. 05/25/13   Azalia Bilis, MD    Allergies    Patient has no known allergies.  Review of Systems   Review of Systems  Constitutional: Positive for fatigue and fever. Negative for chills.  HENT: Negative for congestion.   Eyes: Negative for pain.  Respiratory: Negative for cough and shortness of breath.   Cardiovascular: Negative for chest pain and leg swelling.  Gastrointestinal: Negative for abdominal pain, diarrhea, nausea and vomiting.  Genitourinary: Negative for dysuria.  Musculoskeletal: Negative for myalgias.  Skin: Positive for wound. Negative for rash.       Abscess to face  Neurological: Negative for dizziness and headaches.    Physical Exam Updated Vital Signs BP 138/82 (BP Location: Right Arm)   Pulse (!) 133   Temp 100.3 F (37.9 C) (Oral)   Resp (!) 24   Ht 6\' 4"  (1.93 m)   Wt 102.1 kg   SpO2 100%   BMI 27.39 kg/m   Physical Exam Vitals and nursing note reviewed.  Constitutional:      General: He is not in acute distress.    Comments: 35 year old male appears  older than stated age  HENT:     Head: Normocephalic and atraumatic.     Nose: Nose normal.     Mouth/Throat:     Mouth: Mucous membranes are dry.  Eyes:     General: No scleral icterus. Cardiovascular:     Rate and Rhythm: Regular rhythm. Tachycardia present.     Pulses: Normal pulses.     Heart sounds: Normal heart sounds.  Pulmonary:     Effort: Pulmonary effort is normal. No respiratory distress.     Breath sounds: Normal breath sounds. No wheezing.  Abdominal:     Palpations: Abdomen is soft.     Tenderness: There is no abdominal tenderness. There is no guarding or rebound.  Musculoskeletal:     Cervical back: Normal range of motion.     Right lower leg: No edema.     Left lower leg: No edema.  Skin:    General: Skin is warm and dry.     Capillary Refill: Capillary refill takes less than 2 seconds.     Comments: 1.5 cm opening to a approximately 4 cm in diameter abscess located over the right zygomatic process.  There is fluctuance and there is purulent drainage present. Indurated tissue surrounding this.  Neurological:     Mental Status: He is alert. Mental status is at baseline.  Psychiatric:  Mood and Affect: Mood normal.        Behavior: Behavior normal.     ED Results / Procedures / Treatments   Labs (all labs ordered are listed, but only abnormal results are displayed) Labs Reviewed  CULTURE, BLOOD (SINGLE)  LACTIC ACID, PLASMA  LACTIC ACID, PLASMA  COMPREHENSIVE METABOLIC PANEL  CBC WITH DIFFERENTIAL/PLATELET  PROTIME-INR  APTT  URINALYSIS, ROUTINE W REFLEX MICROSCOPIC    EKG EKG Interpretation  Date/Time:  Saturday October 10 2020 16:38:45 EST Ventricular Rate:  126 PR Interval:    QRS Duration: 72 QT Interval:  309 QTC Calculation: 448 R Axis:   17 Text Interpretation: Sinus tachycardia Borderline T abnormalities, diffuse leads Baseline wander in lead(s) V2 No STEMI Confirmed by Alvester Chou [phone]) on 10/10/2020 4:41:20  PM   Radiology No results found.  Procedures Ultrasound ED Peripheral IV (Provider)  Date/Time: 10/10/2020 4:53 PM Performed by: Gailen Shelter, PA Authorized by: Gailen Shelter, PA   Procedure details:    Indications: hydration, multiple failed IV attempts and poor IV access     Skin Prep: chlorhexidine gluconate     Location:  Right AC   Angiocath:  20 G   Bedside Ultrasound Guided: Yes     Images: not archived     Patient tolerated procedure without complications: Yes     Dressing applied: Yes   Comments:     Patient tolerated bedside ultrasound guided IV without difficulty.   (including critical care time)  Medications Ordered in ED Medications  acetaminophen (TYLENOL) tablet 1,000 mg (has no administration in time range)  clindamycin (CLEOCIN) IVPB 900 mg (has no administration in time range)  LORazepam (ATIVAN) injection 0-4 mg (has no administration in time range)    Or  LORazepam (ATIVAN) tablet 0-4 mg (has no administration in time range)  LORazepam (ATIVAN) injection 0-4 mg (has no administration in time range)    Or  LORazepam (ATIVAN) tablet 0-4 mg (has no administration in time range)  thiamine tablet 100 mg (has no administration in time range)    Or  thiamine (B-1) injection 100 mg (has no administration in time range)  ketorolac (TORADOL) 30 MG/ML injection 30 mg (has no administration in time range)  sodium chloride 0.9 % bolus 1,000 mL (has no administration in time range)    ED Course  I have reviewed the triage vital signs and the nursing notes.  Pertinent labs & imaging results that were available during my care of the patient were reviewed by me and considered in my medical decision making (see chart for details).  Clinical Course as of 10/11/20 0000  Sat Oct 10, 2020  1642 35 yo male presenting to ED with abscess in right face.  Reports he was picking at his skin on the cheek and developed swelling and pain a few days ago.  He's had  abscesses in the past that have been drained in the hospital.  Reports difficulty eating and opening mouth due to pain.  Also drinking alcohol heavily and using meth most recently 3 days ago.  NKDA.  ON exam tachycardic, febrile, has swelling and pain of the face, tense abscess involving right maxilla, trismus due to pain, and poor dentitia.  Plan for sepsis evaluation, CT face with IV contrast, IV clindamycin, and we'll reassess for bedside I&D vs hospitalizaiton [MT]  1734 Chest x-ray-agree of radiology read no infiltrate or evidence of infection.  EKG is sinus tachycardia.  No acute abnormalities. [WF]  1734 CBC  with mild leukocytosis RBC count/hemoglobin at the high end of normal.  Very well could be secondary to dehydration however also could be secondary to abscess of face.  CMP with electrolyte abnormalities that are mild and likely related to poor intake.  AST and ALT elevated likely secondary to significant alcohol intake. [WF]  1846 CT max face shows early abscess formation. Plan is to incise and drain. [WF]  1908 CMP with significant transaminitis.  Patient made aware of this.  Lengthy education conversation had with patient he is aware of possibility of impending liver failure and the complications of this. [WF]  1930 Coags within normal months.  Lactic within normal limits.  Blood cultures pending. [WF]  1930 Plan is to discharge with 10 days of clindamycin and ENT follow-up [WF]    Clinical Course User Index [MT] Trifan, Kermit Balo, MD [WF] Gailen Shelter, [place]   MDM Rules/Calculators/A&P                          *** Final Clinical Impression(s) / ED Diagnoses Final diagnoses:  None    Rx / DC Orders ED Discharge Orders    None

## 2020-10-15 LAB — CULTURE, BLOOD (SINGLE)
Culture: NO GROWTH
Special Requests: ADEQUATE

## 2021-09-14 IMAGING — CT CT MAXILLOFACIAL W/ CM
3 of 6 series · 15 of 47 positions shown, 18 images · IV contrast (Omnipaque)
Comparison: 11/26/2017 and prior.

CLINICAL DATA: Maxillary/facial abscess concern for tracking into
face/mouth as there is trismus

EXAM:
CT MAXILLOFACIAL WITH CONTRAST
TECHNIQUE: Multidetector CT imaging of the maxillofacial structures was
performed with intravenous contrast. Multiplanar CT image
reconstructions were also generated.
CONTRAST:  80mL OMNIPAQUE IOHEXOL 300 MG/ML  SOLN

[Series 2: max soft · axial · 0.39mm/px · z∈[-243,-71]mm · 10 of 98 slices shown, 13 images]
[im 6/98  brain]
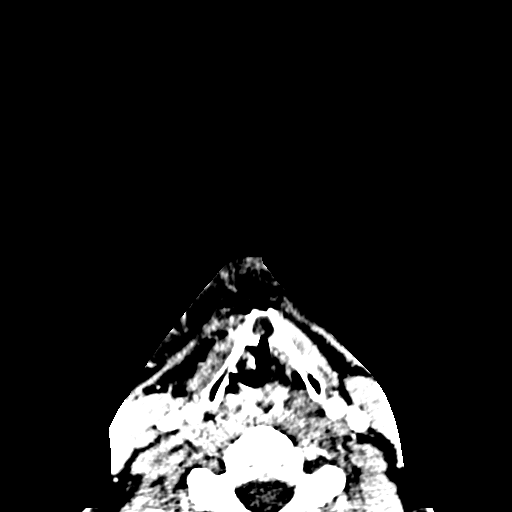
[im 6/98  bone]
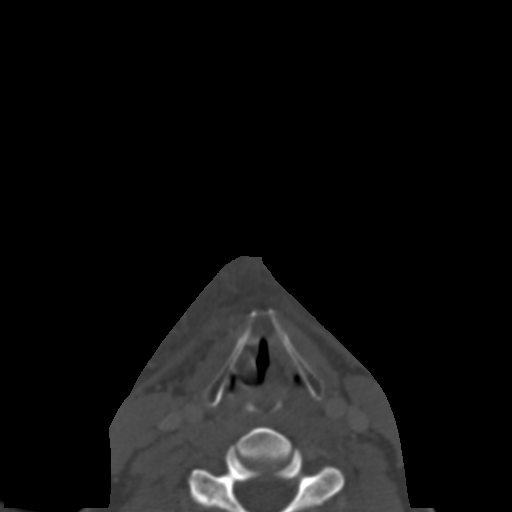
[im 16/98  bone]
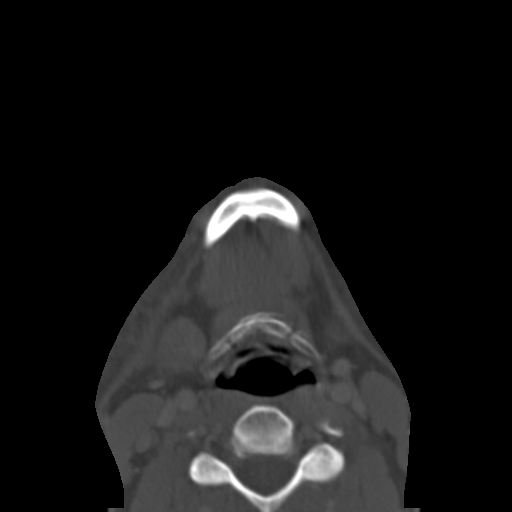
[im 26/98  bone]
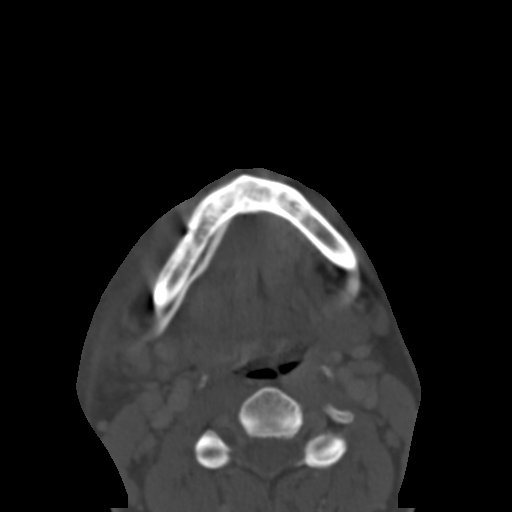
[im 36/98  bone]
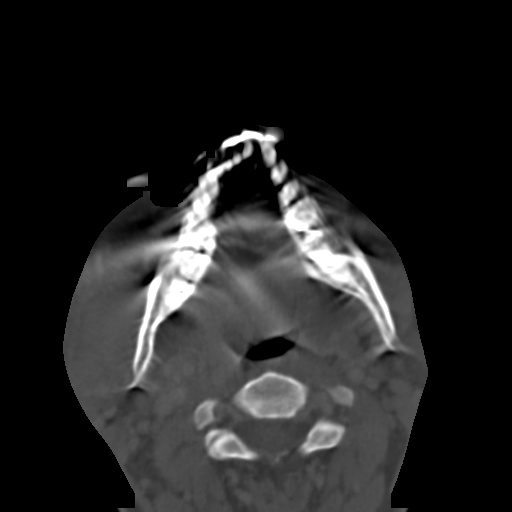
[im 46/98  brain]
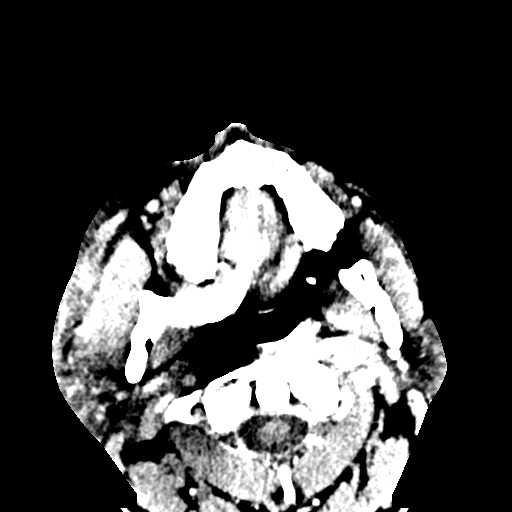
[im 46/98  bone]
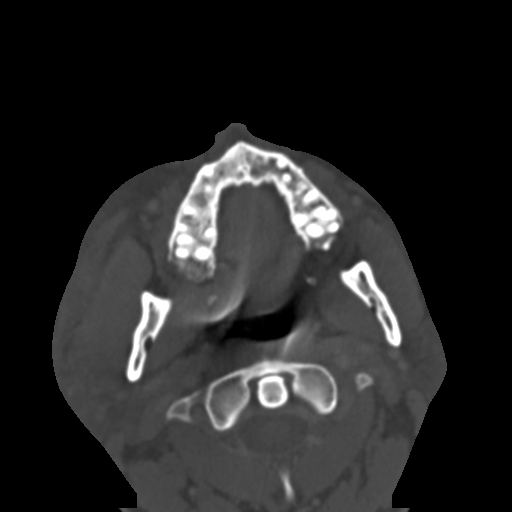
[im 52/98  bone]
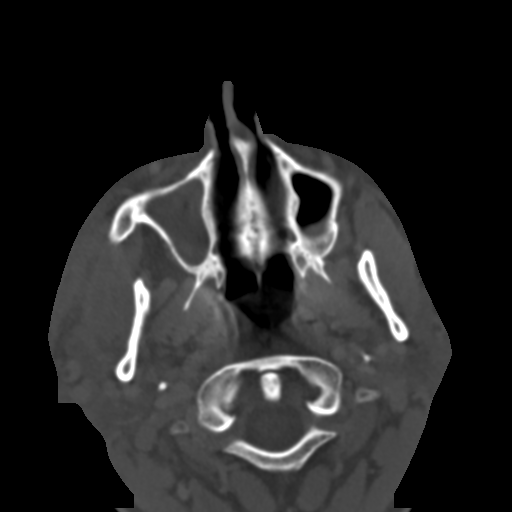
[im 62/98  bone]
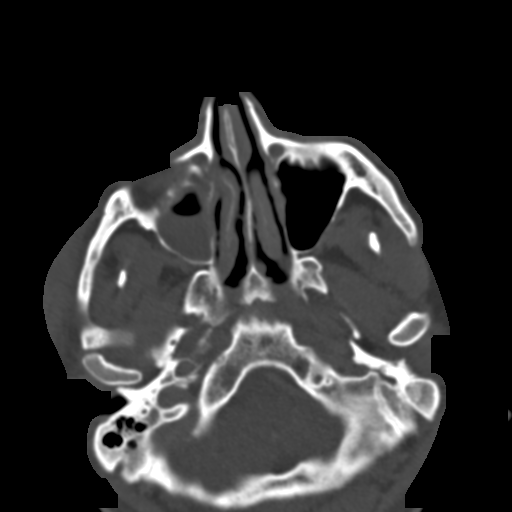
[im 72/98  bone]
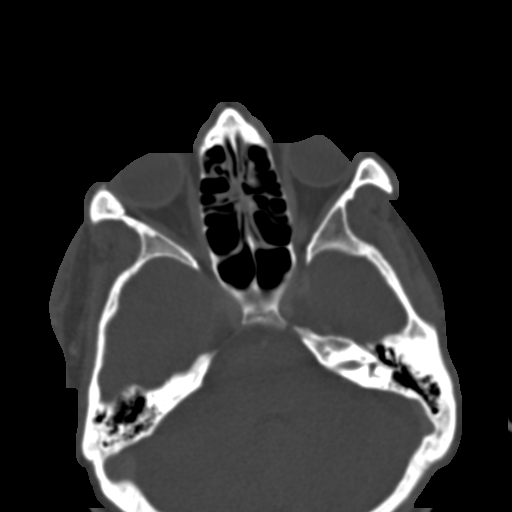
[im 82/98  brain]
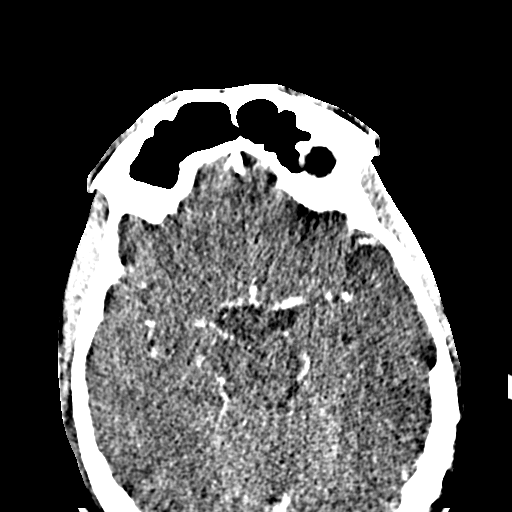
[im 82/98  bone]
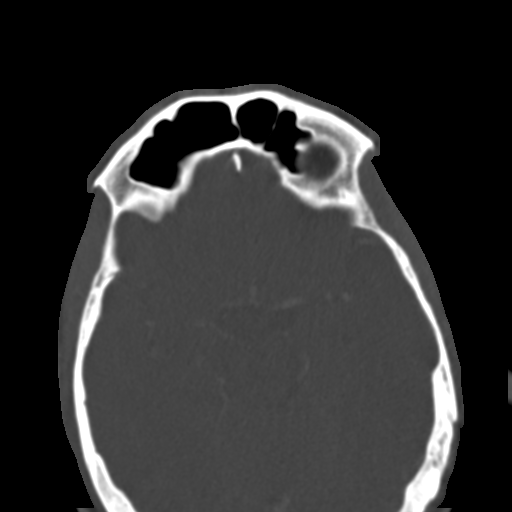
[im 92/98  bone]
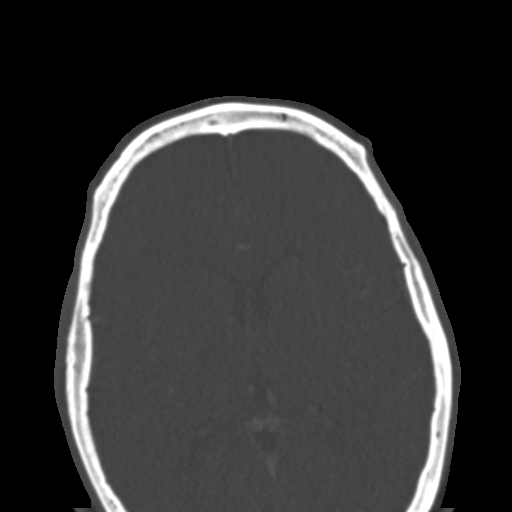

[Series 6: coronal soft · coronal · 0.42mm/px · 3 of 93 slices shown]
[im 24/93  bone]
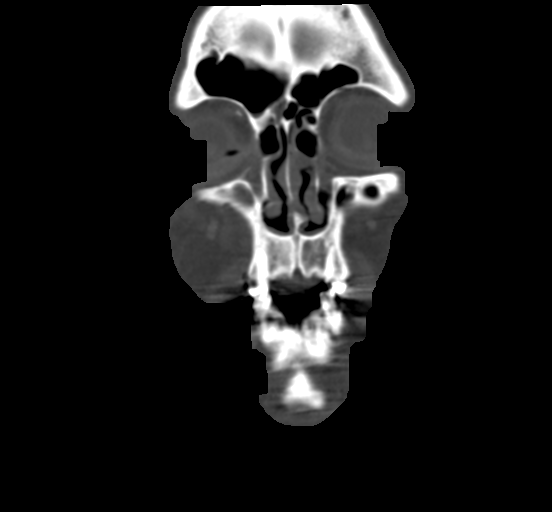
[im 47/93  bone]
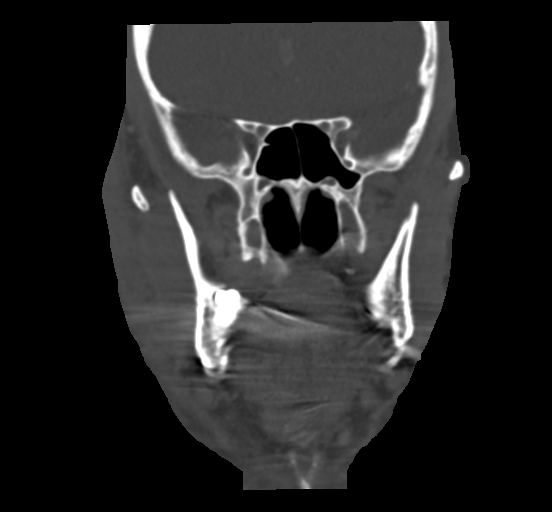
[im 70/93  bone]
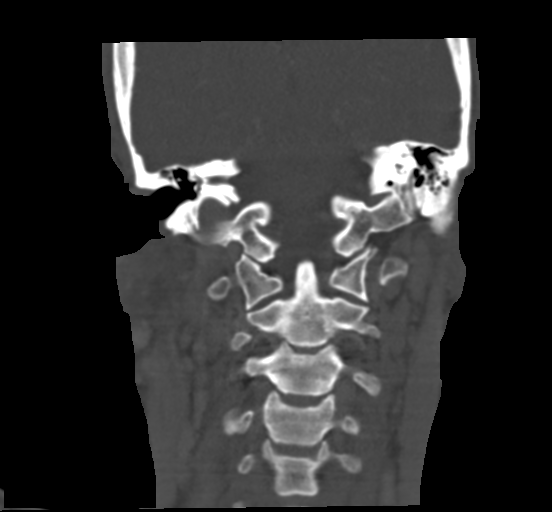

[Series 14: sagittal soft · sagittal · 0.33mm/px · 2 of 95 slices shown]
[im 32/95  bone]
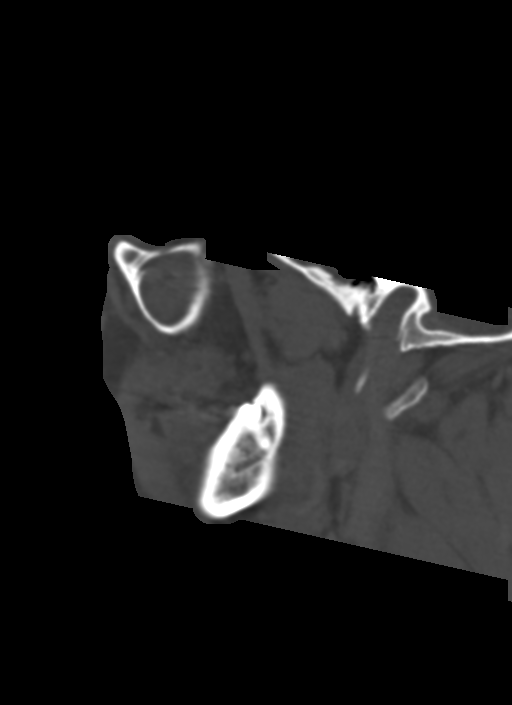
[im 63/95  bone]
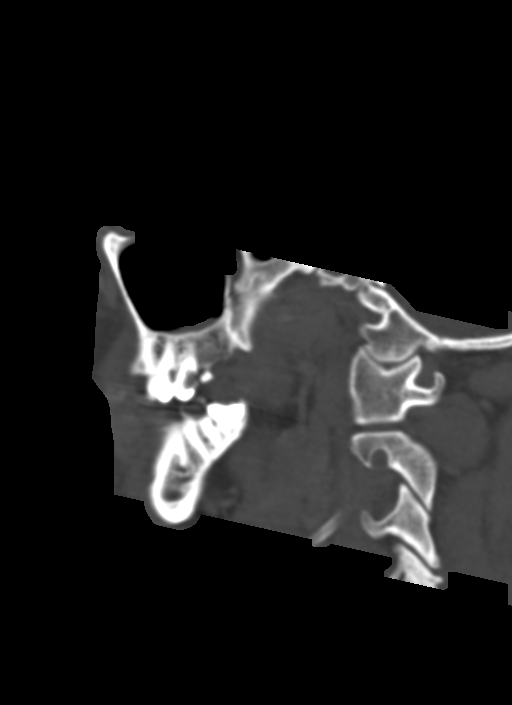

[15 of 47 positions shown; findings below may reference images not displayed]

FINDINGS: Osseous: No fracture or mandibular dislocation. No destructive
process. Carious disease involving the bilateral upper molars.

Orbits: Negative. No traumatic or inflammatory finding.

Sinuses: Mild sinus mucosal thickening and motor secretions. Clear
mastoid air cells.

Soft tissues: Right cheek soft tissue swelling and dermal
thickening. There is a focal cutaneous defect at the level of the
zygoma (2: 58) within underlying ill-defined pocket of free fluid
measuring 1.4 x 1.9 cm. Reactive inflammatory changes are seen
within the right masticator and platysma. Mild reactive inflammation
involving the right parotid. Dental amalgam beam hardening artifact
limits evaluation. Prominent right cervical nodes are reactive.

Limited intracranial: No significant or unexpected finding.
IMPRESSION: Right facial cellulitis with focal cutaneous defect at the level of
the zygoma.

Underlying ill-defined fluid collection measuring 1.9 cm may reflect
early abscess formation.

Reactive inflammatory changes involving the right parotid, masseter
muscle and platysma.

Bilateral upper molar carious disease.
